# Patient Record
Sex: Male | Born: 1965 | Hispanic: Yes | Marital: Single | Smoking: Current every day smoker
Health system: Southern US, Community
[De-identification: ages and names within clinical notes are randomized; demographics above are authoritative.]

## PROBLEM LIST (undated history)

## (undated) DIAGNOSIS — Z86718 Personal history of other venous thrombosis and embolism: Secondary | ICD-10-CM

## (undated) HISTORY — PX: APPENDECTOMY: SHX54

## (undated) HISTORY — PX: CHOLECYSTECTOMY: SHX55

---

## 2014-08-14 ENCOUNTER — Emergency Department (HOSPITAL_COMMUNITY): Payer: Worker's Compensation

## 2014-08-14 ENCOUNTER — Emergency Department (HOSPITAL_COMMUNITY)
Admission: EM | Admit: 2014-08-14 | Discharge: 2014-08-14 | Disposition: A | Discharge status: Home / Self Care | Payer: Worker's Compensation | Attending: Emergency Medicine | Admitting: Emergency Medicine

## 2014-08-14 ENCOUNTER — Encounter (HOSPITAL_COMMUNITY): Payer: Self-pay | Admitting: Emergency Medicine

## 2014-08-14 DIAGNOSIS — Y9289 Other specified places as the place of occurrence of the external cause: Secondary | ICD-10-CM | POA: Diagnosis not present

## 2014-08-14 DIAGNOSIS — S62639B Displaced fracture of distal phalanx of unspecified finger, initial encounter for open fracture: Secondary | ICD-10-CM

## 2014-08-14 DIAGNOSIS — S6992XA Unspecified injury of left wrist, hand and finger(s), initial encounter: Secondary | ICD-10-CM | POA: Diagnosis present

## 2014-08-14 DIAGNOSIS — Y9389 Activity, other specified: Secondary | ICD-10-CM | POA: Insufficient documentation

## 2014-08-14 DIAGNOSIS — S62631B Displaced fracture of distal phalanx of left index finger, initial encounter for open fracture: Secondary | ICD-10-CM | POA: Diagnosis not present

## 2014-08-14 DIAGNOSIS — Z720 Tobacco use: Secondary | ICD-10-CM | POA: Diagnosis not present

## 2014-08-14 DIAGNOSIS — W231XXA Caught, crushed, jammed, or pinched between stationary objects, initial encounter: Secondary | ICD-10-CM | POA: Diagnosis not present

## 2014-08-14 DIAGNOSIS — Y990 Civilian activity done for income or pay: Secondary | ICD-10-CM | POA: Diagnosis not present

## 2014-08-14 HISTORY — DX: Personal history of other venous thrombosis and embolism: Z86.718

## 2014-08-14 MED ORDER — OXYCODONE-ACETAMINOPHEN 5-325 MG PO TABS: 2.0000 | ORAL_TABLET | Freq: Four times a day (QID) | ORAL | Status: AC | PRN

## 2014-08-14 MED ORDER — LIDOCAINE HCL (PF) 1 % IJ SOLN
5.0000 mL | Freq: Once | INTRAMUSCULAR | Status: AC
  Administered 2014-08-14: 5 mL via INTRADERMAL
  Filled 2014-08-14: qty 5

## 2014-08-14 MED ORDER — OXYCODONE-ACETAMINOPHEN 5-325 MG PO TABS
2.0000 | ORAL_TABLET | Freq: Once | ORAL | Status: AC
  Administered 2014-08-14: 2 via ORAL
  Filled 2014-08-14: qty 2

## 2014-08-14 MED ORDER — SULFAMETHOXAZOLE-TRIMETHOPRIM 800-160 MG PO TABS: 1.0000 | ORAL_TABLET | Freq: Two times a day (BID) | ORAL | Status: AC

## 2014-08-14 NOTE — ED Notes (Signed)
The patient was at work and he accidentally closed the lock for the dump truck on his finger.  He has a cut at the end of his left ring finger.  Bleeding is controlled.  He rates his pain 7/10.

## 2014-08-14 NOTE — ED Provider Notes (Signed)
CSN: [id number]     Arrival date & time 08/14/14  1931 History   First MD Initiated Contact with Patient 08/14/14 2028     Chief Complaint  Patient presents with  . Finger Injury    The patient was at work and he accidentally closed the lock for the dump truck on his finger.  He has a cut at the end of his left ring finger.  Bleeding is controlled.     (Consider location/radiation/quality/duration/timing/severity/associated sxs/prior Treatment) HPI Comments: Patient presents to the emergency department with chief complaint of left ring finger injury. States that he had his finger slammed shut in the tailgate of a dump truck. He states that this cut the tip of his finger off. He complains of 7 out of 10 pain. He has not taken anything to alleviate his symptoms. Last tetanus shot was this year. Bleeding is controlled. Symptoms are aggravated with palpation and movement.  The history is provided by the patient. No language interpreter was used.    Past Medical History  Diagnosis Date  . 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 Appendectomy    . Cholecystectomy     History reviewed. No pertinent family history. History  Substance Use Topics  . Smoking status: Current Every Day Smoker -- 1.00 packs/day    Types: Cigarettes  . Smokeless tobacco: Never Used  . Alcohol Use: Yes     Comment: 2/40s a night    Review of Systems  Constitutional: Negative for fever and chills.  Respiratory: Negative for shortness of breath.   Cardiovascular: Negative for chest pain.  Gastrointestinal: Negative for nausea, vomiting, diarrhea and constipation.  Genitourinary: Negative for dysuria.  Skin: Positive for wound.  All other systems reviewed and are negative.     Allergies  Review of patient's allergies indicates not on file.  Home Medications   Prior to Admission medications   Medication Sig Start Date End Date Taking? Authorizing Provider    Aspirin-Acetaminophen-Caffeine (GOODY HEADACHE PO) Take 1 Package by mouth as needed (for pain).   Yes Historical Provider, MD   BP 165/107 mmHg  Pulse 66  Temp(Src) 98.9 F (37.2 C) (Oral)  Resp 16  Wt 168 lb (76.204 kg)  SpO2 100% Physical Exam  Constitutional: He is oriented to person, place, and time. He appears well-developed and well-nourished.  HENT:  Head: Normocephalic and atraumatic.  Eyes: Conjunctivae and EOM are normal.  Neck: Normal range of motion.  Cardiovascular: Normal rate.   Pulmonary/Chest: Effort normal.  Abdominal: He exhibits no distension.  Musculoskeletal: Normal range of motion.  5/5 flexion and extension of left ring finger  Neurological: He is alert and oriented to person, place, and time.  Sensation intact  Skin: Skin is dry.  Left ring finger as pictured below  Psychiatric: He has a normal mood and affect. His behavior is normal. Judgment and thought content normal.  Nursing note and vitals reviewed.   ED Course  Procedures (including critical care time) Labs Review Labs Reviewed - No data to display  Imaging Review Dg Finger Ring Left  08/14/2014   CLINICAL DATA:  Recent crush injury in the distal fourth digit with pain, initial encounter  EXAM: LEFT RING FINGER 2+V  COMPARISON:  None.  FINDINGS: Significant soft tissue irregularity is noted related to the recent crush injury. Small undisplaced distal phalangeal tuft fracture is identified as well as a second mildly displaced fracture along palmar aspect  of the distal phalanx.  IMPRESSION: Soft tissue and bony injury as described above.   Electronically Signed   By: Alcide CleverMark  Lukens M.D.   On: 08/14/2014 20:16     EKG Interpretation None           Procedure Digital block Performed by me, authorized by me 3 mL of 1% lidocaine without epinephrine was used to ring block the patient's left ring finger, the distal phalanx was then copiously irrigated with sterile water. The finger was then  dressed in Xeroform and gauze, and patient placed in a static finger splint.  MDM   Final diagnoses:  Open fracture of tuft of distal phalanx of finger, initial encounter    Patient with left ring finger injury. Appears to have partially degloved the distal phalanx. Patient discussed with Dr. Izora Ribasoley, who recommends irrigating the wound, covering an Xeroform, splinting, and having the patient follow-up with him in his office next week. Also recommend starting antibiotics.   Roxy Horsemanobert Diyan Dave, PA-C 08/14/14 2354  Samuel JesterKathleen McManus, DO 08/16/14 [phone]

## 2014-08-14 NOTE — Discharge Instructions (Signed)
Finger Avulsion  °When the tip of the finger is lost, a new nail may grow back if part of the fingernail is left. The new nail may be deformed. If just the tip of the finger is lost, no repair may be needed unless there is bone showing. If bone is showing, your caregiver may need to remove the protruding bone and put on a bandage. Your caregiver will do what is best for you. Most of the time when a fingertip is lost, the end will gradually grow back on and look fairly normal, but it may remain sensitive to pressure and temperature extremes for a long time. °HOME CARE INSTRUCTIONS  °· Keep your hand elevated above your heart to relieve pain and swelling. °· Keep your dressing dry and clean. °· Change your bandage in 24 hours or as directed. °· Only take over-the-counter or prescription medicines for pain, discomfort, or fever as directed by your caregiver. °· See your caregiver as needed for problems. °SEEK MEDICAL CARE IF:  °· You have increased pain, swelling, drainage, or bleeding. °· You have a fever. °· You have swelling that spreads from your finger and into your hand. °Make sure to check to see if you need a tetanus booster. °Document Released: 06/05/2001 Document Revised: 09/26/2011 Document Reviewed: 04/30/2008 °ExitCare® Patient Information ©2015 [employer]. This information is not intended to replace advice given to you by your health care provider. Make sure you discuss any questions you have with your health care provider. ° °

## 2014-08-15 ENCOUNTER — Emergency Department (HOSPITAL_COMMUNITY)
Admission: EM | Admit: 2014-08-15 | Discharge: 2014-08-15 | Disposition: A | Discharge status: Home / Self Care | Payer: Worker's Compensation | Attending: Emergency Medicine | Admitting: Emergency Medicine

## 2014-08-15 ENCOUNTER — Encounter (HOSPITAL_COMMUNITY): Payer: Self-pay | Admitting: Emergency Medicine

## 2014-08-15 DIAGNOSIS — Y990 Civilian activity done for income or pay: Secondary | ICD-10-CM | POA: Insufficient documentation

## 2014-08-15 DIAGNOSIS — Y9289 Other specified places as the place of occurrence of the external cause: Secondary | ICD-10-CM | POA: Insufficient documentation

## 2014-08-15 DIAGNOSIS — Z720 Tobacco use: Secondary | ICD-10-CM | POA: Diagnosis not present

## 2014-08-15 DIAGNOSIS — S67194A Crushing injury of right ring finger, initial encounter: Secondary | ICD-10-CM | POA: Diagnosis not present

## 2014-08-15 DIAGNOSIS — W231XXA Caught, crushed, jammed, or pinched between stationary objects, initial encounter: Secondary | ICD-10-CM | POA: Insufficient documentation

## 2014-08-15 DIAGNOSIS — Z7982 Long term (current) use of aspirin: Secondary | ICD-10-CM | POA: Diagnosis not present

## 2014-08-15 DIAGNOSIS — Z86718 Personal history of other venous thrombosis and embolism: Secondary | ICD-10-CM | POA: Insufficient documentation

## 2014-08-15 DIAGNOSIS — S6991XA Unspecified injury of right wrist, hand and finger(s), initial encounter: Secondary | ICD-10-CM | POA: Diagnosis present

## 2014-08-15 DIAGNOSIS — Z880 Allergy status to penicillin: Secondary | ICD-10-CM | POA: Insufficient documentation

## 2014-08-15 DIAGNOSIS — S6710XA Crushing injury of unspecified finger(s), initial encounter: Secondary | ICD-10-CM

## 2014-08-15 DIAGNOSIS — Y9389 Activity, other specified: Secondary | ICD-10-CM | POA: Diagnosis not present

## 2014-08-15 MED ORDER — OXYCODONE-ACETAMINOPHEN 5-325 MG PO TABS: 1.0000 | ORAL_TABLET | Freq: Once | ORAL | Status: DC

## 2014-08-15 MED ORDER — HYDROMORPHONE HCL 1 MG/ML IJ SOLN
1.0000 mg | Freq: Once | INTRAMUSCULAR | Status: AC
  Administered 2014-08-15: 1 mg via INTRAMUSCULAR
  Filled 2014-08-15: qty 1

## 2014-08-15 MED ORDER — HYDROMORPHONE HCL 1 MG/ML IJ SOLN
1.0000 mg | Freq: Once | INTRAMUSCULAR | Status: AC
Start: 2014-08-15 — End: 2014-08-15
  Administered 2014-08-15: 1 mg via INTRAMUSCULAR
  Filled 2014-08-15: qty 1

## 2014-08-15 MED ORDER — LIDOCAINE HCL (PF) 1 % IJ SOLN
30.0000 mL | Freq: Once | INTRAMUSCULAR | Status: AC
  Administered 2014-08-15: 30 mL
  Filled 2014-08-15: qty 30

## 2014-08-15 NOTE — ED Notes (Signed)
The patient was here earlier for a finger injury.  He was seen and sent home.  He is back because his finger will not stop bleeding.  The patient rates his pain 3/10.

## 2014-08-15 NOTE — ED Provider Notes (Signed)
CSN: [id number]     Arrival date & time 08/15/14  [id number] History   First MD Initiated Contact with Patient 08/15/14 0700     Chief Complaint  Patient presents with  . Finger Injury    The patient was here earlier for a finger injury.  He was seen and sent home.  He is back because his finger will not stop bleeding.     (Consider location/radiation/quality/duration/timing/severity/associated sxs/prior Treatment) HPI Patient presents several hours after being evaluated here due to finger injury, now with concern for ongoing bleeding, pain. Patient notes that since discharge she has continued to have bleeding, without other new injuries. He has soaked through approximately 8 male sanitary pads. No new loss of sensation or other complaints.  Past Medical History  Diagnosis Date  . 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 Appendectomy    . Cholecystectomy     History reviewed. No pertinent family history. History  Substance Use Topics  . Smoking status: Current Every Day Smoker -- 1.00 packs/day    Types: Cigarettes  . Smokeless tobacco: Never Used  . Alcohol Use: Yes     Comment: 2/40s a night    Review of Systems  Constitutional: Negative for fever and chills.  Gastrointestinal: Negative for nausea.  Musculoskeletal:       History of present illness  Allergic/Immunologic: Negative for immunocompromised state.  Neurological: Negative for weakness.  Hematological: Does not bruise/bleed easily.      Allergies  Penicillins  Home Medications   Prior to Admission medications   Medication Sig Start Date End Date Taking? Authorizing Provider  Aspirin-Acetaminophen-Caffeine (GOODY HEADACHE PO) Take 1 Package by mouth as needed (for pain).   Yes Historical Provider, MD  oxyCODONE-acetaminophen (PERCOCET/ROXICET) 5-325 MG per tablet Take 2 tablets by mouth every 6 (six) hours as needed for severe pain. 08/14/14   Roxy Horsemanobert Browning,  PA-C  sulfamethoxazole-trimethoprim (BACTRIM DS,SEPTRA DS) 800-160 MG per tablet Take 1 tablet by mouth 2 (two) times daily. 08/14/14 08/21/14  Roxy Horsemanobert Browning, PA-C   BP 131/78 mmHg  Pulse 77  Temp(Src) 98.8 F (37.1 C) (Oral)  Resp 12  SpO2 100% Physical Exam  Constitutional: He is oriented to person, place, and time. He appears well-developed and well-nourished.  HENT:  Head: Normocephalic and atraumatic.  Eyes: Conjunctivae and EOM are normal.  Neck: Normal range of motion.  Cardiovascular: Normal rate.   Pulmonary/Chest: Effort normal.  Abdominal: He exhibits no distension.  Musculoskeletal: Normal range of motion.  5/5 flexion and extension of left ring finger Patient moves the mid and distal knuckle independently  Neurological: He is alert and oriented to person, place, and time.  Sensation intact  Skin: Skin is dry.  Crush injury to the distal left fourth digit, with extensive soft tissue exposure  Psychiatric: He has a normal mood and affect. His behavior is normal. Judgment and thought content normal.  Nursing note and vitals reviewed.   ED Course  NERVE BLOCK Date/Time: 08/15/2014 8:14 AM Performed by: Gerhard MunchLOCKWOOD, Bob Eastwood Authorized by: Gerhard MunchLOCKWOOD, Raegen Tarpley Consent: The procedure was performed in an emergent situation. Verbal consent obtained. Risks and benefits: risks, benefits and alternatives were discussed Consent given by: patient Patient understanding: patient states understanding of the procedure being performed Patient consent: the patient's understanding of the procedure matches consent given Procedure consent: procedure consent matches procedure scheduled Relevant documents: relevant documents present and verified Test results: test results available and properly labeled Site  marked: the operative site was marked Imaging studies: imaging studies available Required items: required blood products, implants, devices, and special equipment available Patient identity  confirmed: verbally with patient Time out: Immediately prior to procedure a "time out" was called to verify the correct patient, procedure, equipment, support staff and site/side marked as required. Indications: pain relief, extensive wound and fracture Body area: upper extremity Nerve: digital Laterality: left Patient sedated: no Preparation: Patient was prepped and draped in the usual sterile fashion. Patient position: sitting Needle gauge: 25 G Location technique: anatomical landmarks Local anesthetic: lidocaine 1% without epinephrine Anesthetic total: 9 ml Outcome: pain improved Patient tolerance: Patient tolerated the procedure well with no immediate complications   (including critical care time) Imaging Review Dg Finger Ring Left  08/14/2014   CLINICAL DATA:  Recent crush injury in the distal fourth digit with pain, initial encounter  EXAM: LEFT RING FINGER 2+V  COMPARISON:  None.  FINDINGS: Significant soft tissue irregularity is noted related to the recent crush injury. Small undisplaced distal phalangeal tuft fracture is identified as well as a second mildly displaced fracture along palmar aspect of the distal phalanx.  IMPRESSION: Soft tissue and bony injury as described above.   Electronically Signed   By: Alcide CleverMark  Lukens M.D.   On: 08/14/2014 20:16    I reviewed the chart from yesterday, including photographs, which appears similar to this morning's presentation.  After the initial evaluation discussed patient's case with our hand surgery colleague. Dr.Coley will assist with definitive care.  MDM  Patient presents with concern of ongoing pain, bleeding from a crush injury sustained yesterday. With persistent bleeding, pain, discussed patient's case with surgery. Patient had a digital block provided, intramuscular narcotics, both well tolerated. Definitive management provided by Dr. Izora Ribasoley.  Gerhard Munchobert Ezana Hubbert, MD 08/15/14 [phone]

## 2014-08-15 NOTE — Consult Note (Signed)
Reason for Consult:LRF injury Referring Physician: ER  CC:I got my finer caught in a dump truck gate  HPI:  Lance Ramos is an 49 y.o. right handed male who presents with    Tip avulsion of LRF ; finger trapped in tailgate at work, seen in ER yesterday, back this am with continued bleeding    .   Pain is rated at    6/10 and is described as sharp/dull.  Pain is constant/intermittant.  Pain is made better by rest/immobilization, worse with motion.   Associated signs/symptoms:continued bleeding Previous treatment:  Wound cleansed and wrapped  Past Medical History  Diagnosis Date  . 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 Appendectomy    . Cholecystectomy      History reviewed. No pertinent family history.  Social History:  reports that he has been smoking Cigarettes.  He has been smoking about 1.00 pack per day. He has never used smokeless tobacco. He reports that he drinks alcohol. He reports that he uses illicit drugs (Marijuana).  Allergies:  Allergies  Allergen Reactions  . Penicillins     rash    Medications: I have reviewed the patient's current medications.  No results found for this or any previous visit (from the past 48 hour(s)).  Dg Finger Ring Left  08/14/2014   CLINICAL DATA:  Recent crush injury in the distal fourth digit with pain, initial encounter  EXAM: LEFT RING FINGER 2+V  COMPARISON:  None.  FINDINGS: Significant soft tissue irregularity is noted related to the recent crush injury. Small undisplaced distal phalangeal tuft fracture is identified as well as a second mildly displaced fracture along palmar aspect of the distal phalanx.  IMPRESSION: Soft tissue and bony injury as described above.   Electronically Signed   By: Alcide CleverMark  Lukens M.D.   On: 08/14/2014 20:16    Pertinent items are noted in HPI. Temp:  [98.8 F (37.1 C)-98.9 F (37.2 C)] 98.8 F (37.1 C) (05/07 0657) Pulse Rate:  [66-79] 77 (05/07  0700) Resp:  [12-16] 12 (05/07 0657) BP: (131-165)/(78-107) 131/78 mmHg (05/07 0700) SpO2:  [100 %] 100 % (05/07 0700) Weight:  [76.204 kg (168 lb)] 76.204 kg (168 lb) (05/06 1938) General appearance: alert and cooperative Resp: clear to auscultation bilaterally Cardio: regular rate and rhythm GI: soft, non-tender; bowel sounds normal; no masses,  no organomegaly LRF with tip amputation, avulsion, venous ooze; other fingers, RUE wnl   Assessment: Tip amputation of LRF Plan: Cauterize bleeding, wound care I have discussed this treatment plan in detail with patient and/or family, including the risks of the recommended treatment or surgery, the benefits and the alternatives.  The patient and/or caregiver understands that additional treatment may be necessary.  Lance Ramos 08/15/2014, 8:25 AM

## 2014-09-13 ENCOUNTER — Emergency Department (HOSPITAL_COMMUNITY): Payer: Self-pay

## 2014-09-13 ENCOUNTER — Encounter (HOSPITAL_COMMUNITY): Payer: Self-pay | Admitting: Emergency Medicine

## 2014-09-13 ENCOUNTER — Emergency Department (HOSPITAL_COMMUNITY)
Admission: EM | Admit: 2014-09-13 | Discharge: 2014-09-13 | Disposition: A | Discharge status: Home / Self Care | Payer: Self-pay | Attending: Emergency Medicine | Admitting: Emergency Medicine

## 2014-09-13 DIAGNOSIS — W010XXA Fall on same level from slipping, tripping and stumbling without subsequent striking against object, initial encounter: Secondary | ICD-10-CM | POA: Insufficient documentation

## 2014-09-13 DIAGNOSIS — Z720 Tobacco use: Secondary | ICD-10-CM | POA: Insufficient documentation

## 2014-09-13 DIAGNOSIS — Z880 Allergy status to penicillin: Secondary | ICD-10-CM | POA: Insufficient documentation

## 2014-09-13 DIAGNOSIS — Z86718 Personal history of other venous thrombosis and embolism: Secondary | ICD-10-CM | POA: Insufficient documentation

## 2014-09-13 DIAGNOSIS — G5632 Lesion of radial nerve, left upper limb: Secondary | ICD-10-CM | POA: Insufficient documentation

## 2014-09-13 DIAGNOSIS — Y999 Unspecified external cause status: Secondary | ICD-10-CM | POA: Insufficient documentation

## 2014-09-13 DIAGNOSIS — Y929 Unspecified place or not applicable: Secondary | ICD-10-CM | POA: Insufficient documentation

## 2014-09-13 DIAGNOSIS — Y939 Activity, unspecified: Secondary | ICD-10-CM | POA: Insufficient documentation

## 2014-09-13 NOTE — Discharge Instructions (Signed)
Radial Nerve Palsy Wrist drop is also known as radial nerve palsy. It is a condition in which you can not extend your wrist. This means if you are standing with your elbow bent at a right angle and with the top of your hand pointed at the ceiling, you can not hold your hand up. It falls toward the floor.  This action of extending your wrist is caused by the muscles in the back of your arm. These muscles are controlled by the radial nerve. This means that anything affecting the radial nerve so it can not tell the muscles how to work will cause wrist drop. This is medically called radial nerve palsy. Also the radial nerve is a motor and sensory nerve so anything affecting it causes problems with movement and feeling. CAUSES  Some more common causes of wrist drop are:  A break (fracture) of the large bone in the arm between your shoulder and your elbow (humerus). This is because the radial nerve winds around the humerus.  Improper use of crutches causes this because the radial nerve runs through the armpit (axilla). Crutches which are too long can put pressure on the nerve. This is sometimes called crutch palsy.  Falling asleep with your arm over a chair and supported on the back is a common cause. This is sometimes called Saturday Night Syndrome.  Wrist drop can be associated with lead poisoning because of the effect of lead on the radial nerve. SYMPTOMS  The wrist drop is an obvious problem, but there may also be numbness in the back of the arm, forearm or hand which provides feeling in these areas by the radial nerve. There can be difficulty straightening out the elbow in addition to the wrist. There may be numbness, tingling, pain, burning sensations or other abnormal feelings. Symptoms depend entirely on where the radial nerve is injured. DIAGNOSIS   Wrist drop is obvious just by looking at it. Your caregiver may make the diagnosis by taking your history and doing a couple tests.  One test which  may be done is a nerve conduction study. This test shows if the radial nerve is conducting signals well. If not, it can determine where the nerve problem is.  Sometimes X-ray studies are done. Your caregiver will determine if further testing needs to be done. TREATMENT   Usually if the problem is found to be pressure on the nerve, simply removing the pressure will allow the nerve to go back to normal in a few weeks to a few months. Other treatments will depend upon the cause found.  Only take over-the-counter or prescription medicines for pain, discomfort, or fever as directed by your caregiver.  Sometimes seizure medications are used.  Steroids are sometimes given to decrease swelling if it is thought to be a possible cause. Document Released: 12/01/2005 Document Revised: 06/19/2011 Document Reviewed: 06/11/2013 [facility] Patient Information 2015 [place], [place]. This information is not intended to replace advice given to you by your health care provider. Make sure you discuss any questions you have with your health care provider. Wrist Splint A wrist splint is a brace that holds your wrist in a fixed position. It can be used to stabilize your wrist so that broken bones and sprains can heal faster, with less pain. It can also help to relieve pressure on the nerve that runs down the middle of your arm (median nerve). Splints are available in drugstores without a prescription. They are also available by prescription from orthopedic and medical  supply stores. Custom splints made from lightweight materials can be made by physical or occupational therapist. HOME CARE INSTRUCTIONS  Wear your splint as instructed by your caregiver. It may be worn while you sleep.  Your caregiver may instruct you how to perform certain exercises at home. These exercises help maintain muscle strength in your hand and wrist. They also help to maintain motion in your fingers. SEEK MEDICAL CARE IF:  You start to lose  feeling in your hand or fingers.  Your skin or fingernails turn blue or gray, or they feel cold. MAKE SURE YOU:   Understand these instructions.  Will watch your condition.  Will get help right away if you are not doing well or get worse. Document Released: 03/09/2006 Document Revised: 06/19/2011 Document Reviewed: 07/08/2013 [facility] Patient Information 2015 [place], [place]. This information is not intended to replace advice given to you by your health care provider. Make sure you discuss any questions you have with your health care provider.

## 2014-09-13 NOTE — ED Notes (Addendum)
Pt c/o L arm injury after falling onto his L arm and caught himself on his L elbow. Pt c/o ongoing numbness. Pt able to feel this RN palpate each part of his arm. Pt moving fingers and arm in triage. Pt able to squeeze RN's fingers with his own hand. A&Ox4 and ambulatory. Pt denies any other complaints. Pt originally denied pain but when asked a second time sts, I feel a sharp pain in my L wrist and L elbow. Pt sts "It feels like I slept on it wrong."

## 2014-09-13 NOTE — ED Provider Notes (Signed)
CSN: [id number]     Arrival date & time 09/13/14  1528 History   First MD Initiated Contact with Patient 09/13/14 1839     Chief Complaint  Patient presents with  . Arm Injury   Lance Ramos is a 49 y.o. male who is otherwise healthy who presents to the ED complaining of numbness in his left thumb with weakness after he fell on his left arm 3 days ago. The patient reports he was using a weed eater when he slipped and fell on his left arm 3 days ago. He reports awaking with his left thumb feeling like it was asleep and weakness in his wrist.  He feels like he cannot raise his wrist and has problems raising up his fingers. He reports this has persisted for the past 3 days. He denies any arm pain, wrist pain or hand pain. He describes a shooting sensation from his left elbow into his left thumb. He denies shoulder pain. He denies headache, LOC, shoulder pain, arm pain, or rashes.  He denies sleeping on his arm or injuring his upper arm or armpit. He denies falling asleep on a chair.   (Consider location/radiation/quality/duration/timing/severity/associated sxs/prior Treatment) HPI  Past Medical History  Diagnosis Date  . 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 Appendectomy    . Cholecystectomy     No family history on file. History  Substance Use Topics  . Smoking status: Current Every Day Smoker -- 1.00 packs/day    Types: Cigarettes  . Smokeless tobacco: Never Used  . Alcohol Use: Yes     Comment: 2/40s a night    Review of Systems  Constitutional: Negative for fever.  Musculoskeletal: Negative for back pain and neck pain.  Skin: Negative for rash and wound.  Neurological: Positive for weakness and numbness. Negative for syncope, light-headedness and headaches.      Allergies  Penicillins  Home Medications   Prior to Admission medications   Medication Sig Start Date End Date Taking? Authorizing Provider   Aspirin-Acetaminophen-Caffeine (GOODY HEADACHE PO) Take 1 Package by mouth as needed (for pain).    Historical Provider, MD  oxyCODONE-acetaminophen (PERCOCET/ROXICET) 5-325 MG per tablet Take 2 tablets by mouth every 6 (six) hours as needed for severe pain. 08/14/14   Roxy Horsemanobert Browning, PA-C   BP 136/80 mmHg  Pulse 81  Temp(Src) 98 F (36.7 C) (Oral)  Resp 16  SpO2 100% Physical Exam  Constitutional: He appears well-developed and well-nourished. No distress.  HENT:  Head: Normocephalic and atraumatic.  Eyes: Conjunctivae are normal. Pupils are equal, round, and reactive to light. Right eye exhibits no discharge. Left eye exhibits no discharge.  Neck: Neck supple.  Cardiovascular: Normal rate, regular rhythm, normal heart sounds and intact distal pulses.   Pulmonary/Chest: Effort normal and breath sounds normal. No respiratory distress.  Abdominal: Soft. There is no tenderness.  Musculoskeletal: He exhibits no edema or tenderness.  Left wrist drop. No left wrist, hand, forearm, elbow, humeral or shoulder tenderness. He has full range of motion of his left elbow, and shoulder. He has good strength of his elbow and upper arm. No deformity noted. No left arm, hand or wrist edema or deformity.    Lymphadenopathy:    He has no cervical adenopathy.  Neurological: He is alert. Coordination normal.  Patient reports decreased sensation to his left thumb. He is unable to make a thumbs up or cross his fingers. He is able to  make a fist but has a wrist drop. Consistent with a radial nerve palsy.   Skin: Skin is warm and dry. No rash noted. He is not diaphoretic.  Psychiatric: He has a normal mood and affect. His behavior is normal.  Nursing note and vitals reviewed.   ED Course  Procedures (including critical care time) Labs Review Labs Reviewed - No data to display  Imaging Review Dg Elbow Complete Left  09/13/2014   CLINICAL DATA:  Posterior elbow pain with numbness in the hand after falling 3  days ago. Initial encounter.  EXAM: LEFT ELBOW - COMPLETE 3+ VIEW  COMPARISON:  None.  FINDINGS: The mineralization and alignment are normal. There is no evidence of acute fracture or dislocation. The joint spaces are maintained. There is no joint effusion or foreign body. Minimal spurring of the coronoid process and capitellum noted.  IMPRESSION: No acute osseous findings or significant joint effusion.   Electronically Signed   By: Carey Bullocks M.D.   On: 09/13/2014 17:28   Dg Forearm Left  09/13/2014   CLINICAL DATA:  Larey Seat today.  Injured left wrist and forearm.  EXAM: LEFT FOREARM - 2 VIEW; LEFT WRIST - COMPLETE 3+ VIEW  COMPARISON:  None.  FINDINGS: Left forearm:  The wrist and elbow joints are maintained. No acute forearm fracture. No elbow joint effusion.  Left wrist:  The joint spaces are maintained. No acute fractures identified. Suspect remote healed fracture involving the distal radius along the radial styloid. The intercarpal joint spaces are maintained. No fracture.  IMPRESSION: No acute bony findings.   Electronically Signed   By: Rudie Meyer M.D.   On: 09/13/2014 19:06   Dg Wrist Complete Left  09/13/2014   CLINICAL DATA:  Larey Seat today.  Injured left wrist and forearm.  EXAM: LEFT FOREARM - 2 VIEW; LEFT WRIST - COMPLETE 3+ VIEW  COMPARISON:  None.  FINDINGS: Left forearm:  The wrist and elbow joints are maintained. No acute forearm fracture. No elbow joint effusion.  Left wrist:  The joint spaces are maintained. No acute fractures identified. Suspect remote healed fracture involving the distal radius along the radial styloid. The intercarpal joint spaces are maintained. No fracture.  IMPRESSION: No acute bony findings.   Electronically Signed   By: Rudie Meyer M.D.   On: 09/13/2014 19:06     EKG Interpretation None      Filed Vitals:   09/13/14 1551 09/13/14 2001  BP: 132/82 136/80  Pulse: 86 81  Temp: 98.6 F (37 C) 98 F (36.7 C)  TempSrc: Oral Oral  Resp: 20 16  SpO2: 98%  100%     MDM   Meds given in ED:  Medications - No data to display  Discharge Medication List as of 09/13/2014  7:52 PM      Final diagnoses:  Radial nerve palsy, left   This is a 49 year old male who presented to the emergency department complaining of left thumb numbness as well as left wrist drop after he fell on his left forearm while weed eating 3 days ago. On exam the patient has physical exam findings consistent with a left radial nerve palsy. He has left wrist drop and is unable to raise his left thumb completely. He also expresses decreased sensation in his left thumb. The patient has no bony point tenderness. There is no tenderness to his left upper arm or shoulder. He denies falling asleep on his arm. The patient's x-rays of his left forearm, left wrist and  left elbow are all unremarkable. Will place the patient in a wrist splint to use at night and have him follow up at the wellness center. I also suggested physical therapy follow up. The patient reports he does not have insurance at this time and would not be able to do this. He reports he will have insurance in about 1 month and will try and get further follow up at that time. I encouraged at least close follow up at the wellness center who can see him without insurance. He reports he will do this. I advised the patient to return to the ED with new or worsening symptoms or new concerns. The patient verbalized understanding and agreement with plan.   This patient was discussed with Dr. Judd Lien who agrees with assessment and plan.   Everlene Farrier, PA-C 09/14/14 1610  Geoffery Lyons, MD 09/16/14 [phone]

## 2016-12-04 IMAGING — CR DG FOREARM 2V*L*
2 series · 2 of 2 positions shown · non-contrast
Comparison: None.

CLINICAL DATA: Fell today.  Injured left wrist and forearm.

EXAM:
LEFT FOREARM - 2 VIEW; LEFT WRIST - COMPLETE 3+ VIEW

[x forearm ap left]
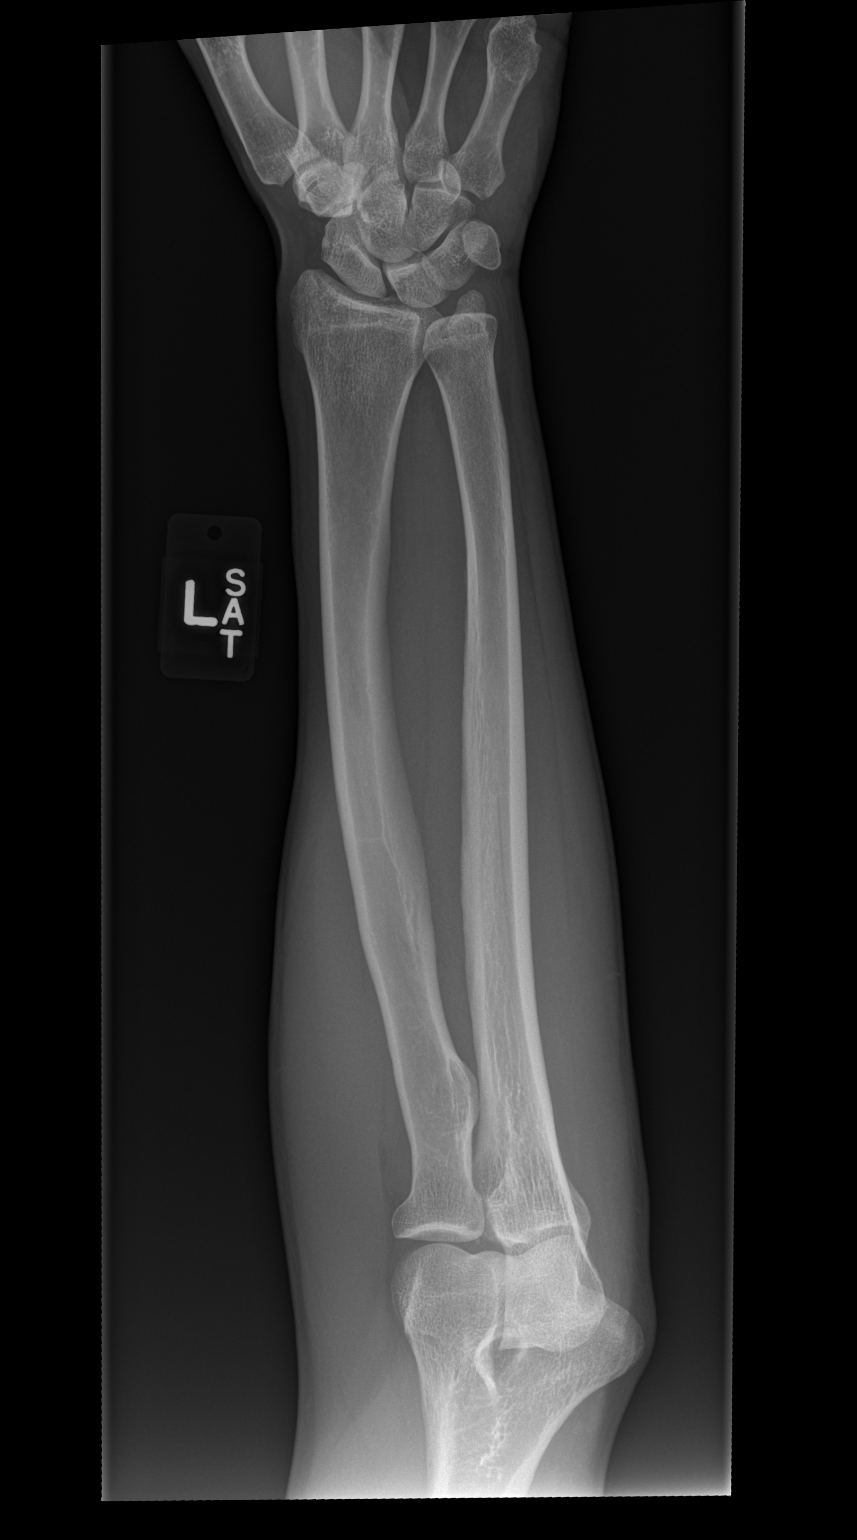

[x forearm lat left]
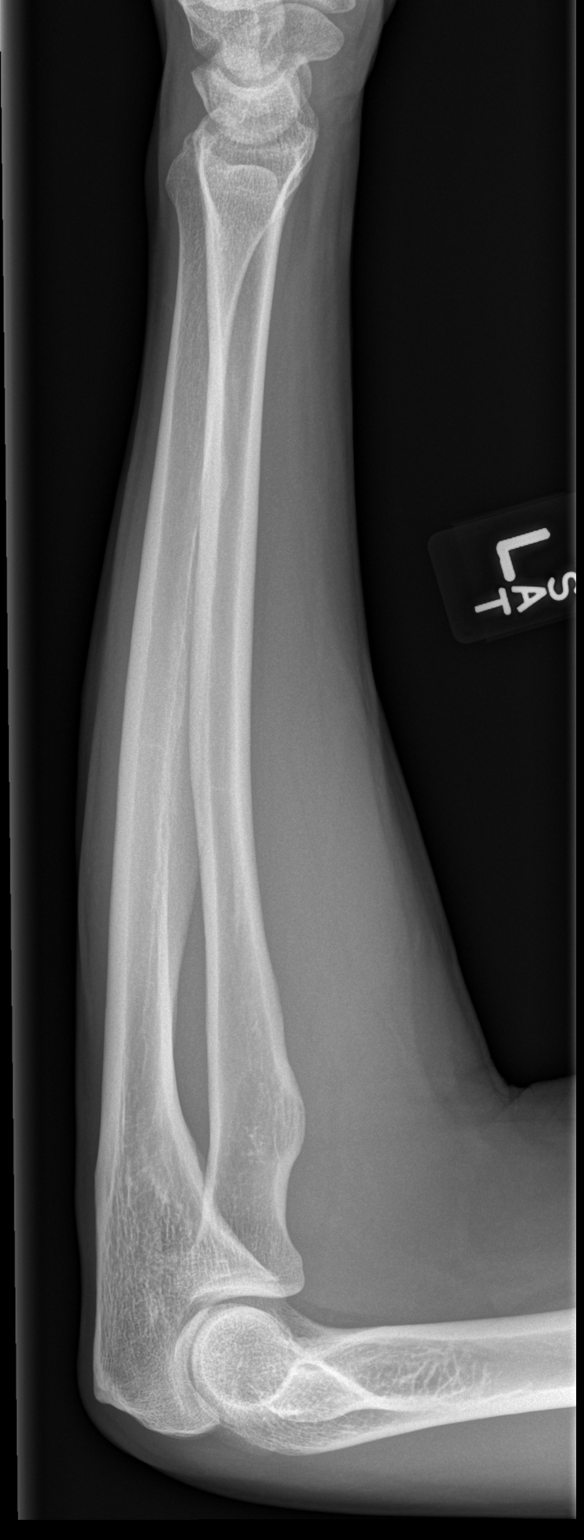

[2 of 2 positions shown; findings below may reference images not displayed]

FINDINGS: Left forearm:

The wrist and elbow joints are maintained. No acute forearm
fracture. No elbow joint effusion.

Left wrist:

The joint spaces are maintained. No acute fractures identified.
Suspect remote healed fracture involving the distal radius along the
radial styloid. The intercarpal joint spaces are maintained. No
fracture.
IMPRESSION: No acute bony findings.
